# Patient Record
Sex: Female | Born: 1950
Health system: Southern US, Community
[De-identification: ages and names within clinical notes are randomized; demographics above are authoritative.]

## PROBLEM LIST (undated history)

## (undated) DIAGNOSIS — C4490 Unspecified malignant neoplasm of skin, unspecified: Secondary | ICD-10-CM

## (undated) HISTORY — PX: SKIN CANCER EXCISION: SHX779

---

## 1998-12-29 ENCOUNTER — Other Ambulatory Visit: Admission: RE | Admit: 1998-12-29 | Discharge: 1998-12-29 | Payer: Self-pay | Admitting: Obstetrics and Gynecology

## 2000-02-25 ENCOUNTER — Other Ambulatory Visit: Admission: RE | Admit: 2000-02-25 | Discharge: 2000-02-25 | Payer: Self-pay | Admitting: Obstetrics and Gynecology

## 2000-03-08 ENCOUNTER — Encounter: Admission: RE | Admit: 2000-03-08 | Discharge: 2000-03-08 | Payer: Self-pay | Admitting: Obstetrics and Gynecology

## 2000-03-08 ENCOUNTER — Encounter: Payer: Self-pay | Admitting: Obstetrics and Gynecology

## 2000-03-14 ENCOUNTER — Encounter: Admission: RE | Admit: 2000-03-14 | Discharge: 2000-03-14 | Payer: Self-pay | Admitting: Obstetrics and Gynecology

## 2000-03-14 ENCOUNTER — Encounter: Payer: Self-pay | Admitting: Obstetrics and Gynecology

## 2001-03-07 ENCOUNTER — Other Ambulatory Visit: Admission: RE | Admit: 2001-03-07 | Discharge: 2001-03-07 | Payer: Self-pay | Admitting: Obstetrics and Gynecology

## 2001-08-10 ENCOUNTER — Encounter: Admission: RE | Admit: 2001-08-10 | Discharge: 2001-08-10 | Payer: Self-pay | Admitting: Obstetrics and Gynecology

## 2001-08-10 ENCOUNTER — Encounter: Payer: Self-pay | Admitting: Obstetrics and Gynecology

## 2002-04-05 ENCOUNTER — Other Ambulatory Visit: Admission: RE | Admit: 2002-04-05 | Discharge: 2002-04-05 | Payer: Self-pay | Admitting: Obstetrics and Gynecology

## 2003-07-11 ENCOUNTER — Other Ambulatory Visit: Admission: RE | Admit: 2003-07-11 | Discharge: 2003-07-11 | Payer: Self-pay | Admitting: Obstetrics and Gynecology

## 2004-09-21 ENCOUNTER — Other Ambulatory Visit: Admission: RE | Admit: 2004-09-21 | Discharge: 2004-09-21 | Payer: Self-pay | Admitting: Obstetrics and Gynecology

## 2005-01-15 ENCOUNTER — Ambulatory Visit: Payer: Self-pay | Admitting: Cardiology

## 2005-10-21 ENCOUNTER — Other Ambulatory Visit: Admission: RE | Admit: 2005-10-21 | Discharge: 2005-10-21 | Payer: Self-pay | Admitting: Obstetrics and Gynecology

## 2010-02-09 ENCOUNTER — Encounter: Payer: Self-pay | Admitting: Cardiology

## 2010-02-09 ENCOUNTER — Encounter: Payer: Self-pay | Admitting: Internal Medicine

## 2010-02-13 ENCOUNTER — Ambulatory Visit: Payer: Self-pay | Admitting: Cardiology

## 2010-02-13 DIAGNOSIS — R079 Chest pain, unspecified: Secondary | ICD-10-CM | POA: Insufficient documentation

## 2010-11-10 NOTE — Assessment & Plan Note (Signed)
Summary: ec6/chest pain with excertion/jml   Visit Type:  Follow-up Primary Provider:  [facility] in [place]  CC:  chest pain.  History of Present Illness: The patient is seen for the evaluation of chest pain.  I had seen her in 2006.  At that time she appeared to be stable and no further workup was done.  We know that her echocardiogram in 2003 showed normal LV function.  She has a history of postural orthostatic tachycardia and has been seen by Dr.Klein for this.  This has not been initiated for her.  In the past few days she had some chest discomfort and she was assessed by her primary care team.  She is now seen today for further followup.  She realizes now that she had some reflux.  Meds were started for this and she feels completely better.  There is a history of coronary disease in the family.  After careful review it is noted that she does not have a sibling or parent with coronary disease at a young age.  Her symptoms recently do not sound like angina.  Current Medications (verified): 1)  Prilosec 20 Mg Cpdr (Omeprazole) .... As Needed 2)  Vitamin C 500 Mg  Tabs (Ascorbic Acid) .... Once Daily 3)  Fish Oil   Oil (Fish Oil) .... Once Daily 4)  Calcium Carbonate-Vitamin D 600-400 Mg-Unit  Tabs (Calcium Carbonate-Vitamin D) .... Two Times A Day  Allergies (verified): No Known Drug Allergies  Past History:  Past Medical History: Last updated: 02/13/2010 LV  normal... echo... 2003 Dysautonomia and postural orthostatic tachycardia.... Dr.Klein Short PR interval Chest pain  2006... insignificant... no stress test   Family History: Family History of Coronary Artery Disease:  Family History of Hyperlipidemia:  Family History of Hypertension:   Social History: Married  Tobacco Use - No.  Alcohol Use - yes -- wine Regular Exercise - yes Full Time -- self employed realtor  Review of Systems       Patient denies fever, chills, headache, sweats, rash, change in  vision, change in hearing, shortness of breath, cough, nausea or vomiting, urinary symptoms.  All of the systems are reviewed and are negative.  Vital Signs:  Patient profile:   60 year old female Height:      62 inches Weight:      112 pounds BMI:     20.56 Pulse rate:   65 / minute BP sitting:   124 / 88  (left arm) Cuff size:   regular  Vitals Entered By: Hardin Negus, RMA (Feb 13, 2010 11:20 AM)  Physical Exam  General:  patient is stable today. Head:  head is atraumatic. Eyes:  no xanthelasma. Neck:  no jugular venous distention. Chest Wall:  no chest wall tenderness. Lungs:  lungs are clear.  Respiratory effort is nonlabored. Heart:  cardiac exam reveals S1 and S2.  There are no clicks or significant murmurs. Abdomen:  abdomen soft. Msk:  patient has some shortening of the fingers on her left hand. Extremities:  no peripheral edema. Skin:  no skin rashes. Psych:  patient is oriented to person time and place.  Affect is normal.   Impression & Recommendations:  Problem # 1:  * POSTURAL ORTHOSTATIC TACHYCARDIA The patient has not been having any problems with her tachycardia.  Further workup is not needed.  Problem # 2:  * SHORT PR INTERVAL EKG again shows short PR interval.  No further workup.  Problem # 3:  CHEST PAIN (ICD-786.50)  At this point I doubt that the patient's chest discomfort is cardiac in origin.  EKG had been done on Feb 09, 2010.  It is compared with a tracing of April 7, 006.  She has a short PR interval.  There is no significant changes.  I carefully reviewed the entire situation with the patient.  I feel that her pain is not cardiac in origin.  She and I discussed whether we should proceed with exercise test.  I feel it is not necessary and she would rather wait to see if she has any recurring problems.  I am comfortable with this.  I will see the patient back on an as-needed basis.  Patient Instructions: 1)  Your physician recommends that you  schedule a follow-up appointment in: as needed with Dr. Myrtis Ser 2)  Your physician recommends that you continue on your current medications as directed. Please refer to the Current Medication list given to you today.

## 2010-11-10 NOTE — Miscellaneous (Signed)
  Clinical Lists Changes  Problems: Added new problem of * POSTURAL ORTHOSTATIC TACHYCARDIA Added new problem of * SHORT PR INTERVAL Added new problem of CHEST PAIN (ICD-786.50) Observations: Added new observation of PAST MED HX: LV  normal... echo... 2003 Dysautonomia and postural orthostatic tachycardia.... Dr.Klein Short PR interval Chest pain  2006... insignificant... no stress test  (02/13/2010 8:35)       Past History:  Past Medical History: LV  normal... echo... 2003 Dysautonomia and postural orthostatic tachycardia.... Dr.Klein Short PR interval Chest pain  2006... insignificant... no stress test

## 2010-11-10 NOTE — Letter (Signed)
Summary: [facility] Family Practice Office Note  [facility] Family Practice Office Note   Imported By: Roderic Ovens 02/26/2010 16:07:51  _____________________________________________________________________  External Attachment:    Type:   Image     Comment:   External Document

## 2016-04-20 DIAGNOSIS — R42 Dizziness and giddiness: Secondary | ICD-10-CM | POA: Diagnosis not present

## 2016-04-20 DIAGNOSIS — H6983 Other specified disorders of Eustachian tube, bilateral: Secondary | ICD-10-CM | POA: Diagnosis not present

## 2016-04-20 DIAGNOSIS — F418 Other specified anxiety disorders: Secondary | ICD-10-CM | POA: Diagnosis not present

## 2016-04-29 DIAGNOSIS — C44519 Basal cell carcinoma of skin of other part of trunk: Secondary | ICD-10-CM | POA: Diagnosis not present

## 2016-06-16 DIAGNOSIS — L578 Other skin changes due to chronic exposure to nonionizing radiation: Secondary | ICD-10-CM | POA: Diagnosis not present

## 2016-06-16 DIAGNOSIS — Z85828 Personal history of other malignant neoplasm of skin: Secondary | ICD-10-CM | POA: Diagnosis not present

## 2016-06-16 DIAGNOSIS — L910 Hypertrophic scar: Secondary | ICD-10-CM | POA: Diagnosis not present

## 2016-06-16 DIAGNOSIS — C44612 Basal cell carcinoma of skin of right upper limb, including shoulder: Secondary | ICD-10-CM | POA: Diagnosis not present

## 2016-07-19 ENCOUNTER — Other Ambulatory Visit: Payer: Self-pay | Admitting: Obstetrics and Gynecology

## 2016-07-19 DIAGNOSIS — Z1231 Encounter for screening mammogram for malignant neoplasm of breast: Secondary | ICD-10-CM

## 2016-08-09 DIAGNOSIS — D225 Melanocytic nevi of trunk: Secondary | ICD-10-CM | POA: Diagnosis not present

## 2016-08-09 DIAGNOSIS — D1801 Hemangioma of skin and subcutaneous tissue: Secondary | ICD-10-CM | POA: Diagnosis not present

## 2016-08-09 DIAGNOSIS — L814 Other melanin hyperpigmentation: Secondary | ICD-10-CM | POA: Diagnosis not present

## 2016-08-09 DIAGNOSIS — Z85828 Personal history of other malignant neoplasm of skin: Secondary | ICD-10-CM | POA: Diagnosis not present

## 2016-08-09 DIAGNOSIS — L821 Other seborrheic keratosis: Secondary | ICD-10-CM | POA: Diagnosis not present

## 2016-08-09 DIAGNOSIS — Z85820 Personal history of malignant melanoma of skin: Secondary | ICD-10-CM | POA: Diagnosis not present

## 2016-08-09 DIAGNOSIS — L570 Actinic keratosis: Secondary | ICD-10-CM | POA: Diagnosis not present

## 2016-08-12 DIAGNOSIS — H2513 Age-related nuclear cataract, bilateral: Secondary | ICD-10-CM | POA: Diagnosis not present

## 2016-10-07 ENCOUNTER — Ambulatory Visit
Admission: RE | Admit: 2016-10-07 | Discharge: 2016-10-07 | Disposition: A | Discharge status: Home / Self Care | Payer: PPO | Source: Ambulatory Visit | Attending: Obstetrics and Gynecology | Admitting: Obstetrics and Gynecology

## 2016-10-07 ENCOUNTER — Encounter: Payer: Self-pay | Admitting: Radiology

## 2016-10-07 DIAGNOSIS — Z1231 Encounter for screening mammogram for malignant neoplasm of breast: Secondary | ICD-10-CM

## 2016-10-14 ENCOUNTER — Other Ambulatory Visit: Payer: Self-pay | Admitting: Obstetrics and Gynecology

## 2016-10-14 DIAGNOSIS — R928 Other abnormal and inconclusive findings on diagnostic imaging of breast: Secondary | ICD-10-CM

## 2016-10-18 ENCOUNTER — Other Ambulatory Visit: Payer: PPO

## 2016-10-21 ENCOUNTER — Ambulatory Visit
Admission: RE | Admit: 2016-10-21 | Discharge: 2016-10-21 | Disposition: A | Discharge status: Home / Self Care | Payer: PPO | Source: Ambulatory Visit | Attending: Obstetrics and Gynecology | Admitting: Obstetrics and Gynecology

## 2016-10-21 DIAGNOSIS — R928 Other abnormal and inconclusive findings on diagnostic imaging of breast: Secondary | ICD-10-CM

## 2016-10-21 DIAGNOSIS — N6489 Other specified disorders of breast: Secondary | ICD-10-CM | POA: Diagnosis not present

## 2016-10-21 DIAGNOSIS — R922 Inconclusive mammogram: Secondary | ICD-10-CM | POA: Diagnosis not present

## 2016-11-04 DIAGNOSIS — F418 Other specified anxiety disorders: Secondary | ICD-10-CM | POA: Diagnosis not present

## 2016-11-04 DIAGNOSIS — J111 Influenza due to unidentified influenza virus with other respiratory manifestations: Secondary | ICD-10-CM | POA: Diagnosis not present

## 2016-11-10 ENCOUNTER — Other Ambulatory Visit: Payer: Self-pay | Admitting: Gynecology

## 2016-11-10 DIAGNOSIS — M810 Age-related osteoporosis without current pathological fracture: Secondary | ICD-10-CM

## 2016-12-14 ENCOUNTER — Other Ambulatory Visit: Payer: PPO

## 2016-12-15 ENCOUNTER — Ambulatory Visit
Admission: RE | Admit: 2016-12-15 | Discharge: 2016-12-15 | Disposition: A | Discharge status: Home / Self Care | Payer: PPO | Source: Ambulatory Visit | Attending: Gynecology | Admitting: Gynecology

## 2016-12-15 DIAGNOSIS — Z780 Asymptomatic menopausal state: Secondary | ICD-10-CM | POA: Diagnosis not present

## 2016-12-15 DIAGNOSIS — M810 Age-related osteoporosis without current pathological fracture: Secondary | ICD-10-CM

## 2017-01-04 DIAGNOSIS — Z01419 Encounter for gynecological examination (general) (routine) without abnormal findings: Secondary | ICD-10-CM | POA: Diagnosis not present

## 2017-01-04 DIAGNOSIS — N898 Other specified noninflammatory disorders of vagina: Secondary | ICD-10-CM | POA: Diagnosis not present

## 2017-01-04 DIAGNOSIS — Z780 Asymptomatic menopausal state: Secondary | ICD-10-CM | POA: Diagnosis not present

## 2017-01-04 DIAGNOSIS — M810 Age-related osteoporosis without current pathological fracture: Secondary | ICD-10-CM | POA: Diagnosis not present

## 2017-02-08 DIAGNOSIS — D225 Melanocytic nevi of trunk: Secondary | ICD-10-CM | POA: Diagnosis not present

## 2017-02-08 DIAGNOSIS — Z85820 Personal history of malignant melanoma of skin: Secondary | ICD-10-CM | POA: Diagnosis not present

## 2017-02-08 DIAGNOSIS — L570 Actinic keratosis: Secondary | ICD-10-CM | POA: Diagnosis not present

## 2017-02-08 DIAGNOSIS — D1801 Hemangioma of skin and subcutaneous tissue: Secondary | ICD-10-CM | POA: Diagnosis not present

## 2017-02-08 DIAGNOSIS — L820 Inflamed seborrheic keratosis: Secondary | ICD-10-CM | POA: Diagnosis not present

## 2017-02-17 DIAGNOSIS — M546 Pain in thoracic spine: Secondary | ICD-10-CM | POA: Diagnosis not present

## 2017-02-17 DIAGNOSIS — H6983 Other specified disorders of Eustachian tube, bilateral: Secondary | ICD-10-CM | POA: Diagnosis not present

## 2017-02-17 DIAGNOSIS — Z1211 Encounter for screening for malignant neoplasm of colon: Secondary | ICD-10-CM | POA: Diagnosis not present

## 2017-03-11 DIAGNOSIS — M546 Pain in thoracic spine: Secondary | ICD-10-CM | POA: Diagnosis not present

## 2017-03-11 DIAGNOSIS — M545 Low back pain: Secondary | ICD-10-CM | POA: Diagnosis not present

## 2017-03-15 DIAGNOSIS — M546 Pain in thoracic spine: Secondary | ICD-10-CM | POA: Diagnosis not present

## 2017-03-15 DIAGNOSIS — M545 Low back pain: Secondary | ICD-10-CM | POA: Diagnosis not present

## 2017-03-17 DIAGNOSIS — M545 Low back pain: Secondary | ICD-10-CM | POA: Diagnosis not present

## 2017-03-17 DIAGNOSIS — M546 Pain in thoracic spine: Secondary | ICD-10-CM | POA: Diagnosis not present

## 2017-03-22 DIAGNOSIS — M546 Pain in thoracic spine: Secondary | ICD-10-CM | POA: Diagnosis not present

## 2017-03-22 DIAGNOSIS — M545 Low back pain: Secondary | ICD-10-CM | POA: Diagnosis not present

## 2017-03-24 DIAGNOSIS — M546 Pain in thoracic spine: Secondary | ICD-10-CM | POA: Diagnosis not present

## 2017-03-24 DIAGNOSIS — M545 Low back pain: Secondary | ICD-10-CM | POA: Diagnosis not present

## 2017-03-29 DIAGNOSIS — M546 Pain in thoracic spine: Secondary | ICD-10-CM | POA: Diagnosis not present

## 2017-03-29 DIAGNOSIS — M545 Low back pain: Secondary | ICD-10-CM | POA: Diagnosis not present

## 2017-03-30 DIAGNOSIS — R195 Other fecal abnormalities: Secondary | ICD-10-CM | POA: Diagnosis not present

## 2017-03-31 DIAGNOSIS — M546 Pain in thoracic spine: Secondary | ICD-10-CM | POA: Diagnosis not present

## 2017-03-31 DIAGNOSIS — M545 Low back pain: Secondary | ICD-10-CM | POA: Diagnosis not present

## 2017-04-05 DIAGNOSIS — M545 Low back pain: Secondary | ICD-10-CM | POA: Diagnosis not present

## 2017-04-05 DIAGNOSIS — M546 Pain in thoracic spine: Secondary | ICD-10-CM | POA: Diagnosis not present

## 2017-04-07 DIAGNOSIS — M546 Pain in thoracic spine: Secondary | ICD-10-CM | POA: Diagnosis not present

## 2017-04-07 DIAGNOSIS — M545 Low back pain: Secondary | ICD-10-CM | POA: Diagnosis not present

## 2017-04-14 DIAGNOSIS — M546 Pain in thoracic spine: Secondary | ICD-10-CM | POA: Diagnosis not present

## 2017-04-14 DIAGNOSIS — M545 Low back pain: Secondary | ICD-10-CM | POA: Diagnosis not present

## 2017-04-22 DIAGNOSIS — M546 Pain in thoracic spine: Secondary | ICD-10-CM | POA: Diagnosis not present

## 2017-04-22 DIAGNOSIS — M545 Low back pain: Secondary | ICD-10-CM | POA: Diagnosis not present

## 2017-05-06 DIAGNOSIS — M546 Pain in thoracic spine: Secondary | ICD-10-CM | POA: Diagnosis not present

## 2017-05-06 DIAGNOSIS — M545 Low back pain: Secondary | ICD-10-CM | POA: Diagnosis not present

## 2017-06-16 DIAGNOSIS — H6692 Otitis media, unspecified, left ear: Secondary | ICD-10-CM | POA: Diagnosis not present

## 2017-06-16 DIAGNOSIS — M62838 Other muscle spasm: Secondary | ICD-10-CM | POA: Diagnosis not present

## 2017-08-30 DIAGNOSIS — H2513 Age-related nuclear cataract, bilateral: Secondary | ICD-10-CM | POA: Diagnosis not present

## 2017-09-13 ENCOUNTER — Other Ambulatory Visit: Payer: Self-pay | Admitting: Obstetrics and Gynecology

## 2017-09-13 DIAGNOSIS — Z139 Encounter for screening, unspecified: Secondary | ICD-10-CM

## 2017-10-25 ENCOUNTER — Ambulatory Visit: Payer: PPO

## 2017-10-27 DIAGNOSIS — H6983 Other specified disorders of Eustachian tube, bilateral: Secondary | ICD-10-CM | POA: Diagnosis not present

## 2017-11-09 ENCOUNTER — Ambulatory Visit
Admission: RE | Admit: 2017-11-09 | Discharge: 2017-11-09 | Disposition: A | Discharge status: Home / Self Care | Payer: PPO | Source: Ambulatory Visit | Attending: Obstetrics and Gynecology | Admitting: Obstetrics and Gynecology

## 2017-11-09 DIAGNOSIS — Z139 Encounter for screening, unspecified: Secondary | ICD-10-CM

## 2017-11-09 DIAGNOSIS — Z1231 Encounter for screening mammogram for malignant neoplasm of breast: Secondary | ICD-10-CM | POA: Diagnosis not present

## 2017-11-18 DIAGNOSIS — H25043 Posterior subcapsular polar age-related cataract, bilateral: Secondary | ICD-10-CM | POA: Diagnosis not present

## 2017-11-18 DIAGNOSIS — H25013 Cortical age-related cataract, bilateral: Secondary | ICD-10-CM | POA: Diagnosis not present

## 2017-11-18 DIAGNOSIS — H2511 Age-related nuclear cataract, right eye: Secondary | ICD-10-CM | POA: Diagnosis not present

## 2017-11-18 DIAGNOSIS — H02839 Dermatochalasis of unspecified eye, unspecified eyelid: Secondary | ICD-10-CM | POA: Diagnosis not present

## 2017-11-18 DIAGNOSIS — H2513 Age-related nuclear cataract, bilateral: Secondary | ICD-10-CM | POA: Diagnosis not present

## 2017-12-29 DIAGNOSIS — Z85820 Personal history of malignant melanoma of skin: Secondary | ICD-10-CM | POA: Diagnosis not present

## 2017-12-29 DIAGNOSIS — L814 Other melanin hyperpigmentation: Secondary | ICD-10-CM | POA: Diagnosis not present

## 2017-12-29 DIAGNOSIS — L570 Actinic keratosis: Secondary | ICD-10-CM | POA: Diagnosis not present

## 2017-12-29 DIAGNOSIS — L820 Inflamed seborrheic keratosis: Secondary | ICD-10-CM | POA: Diagnosis not present

## 2017-12-29 DIAGNOSIS — D1801 Hemangioma of skin and subcutaneous tissue: Secondary | ICD-10-CM | POA: Diagnosis not present

## 2017-12-29 DIAGNOSIS — D225 Melanocytic nevi of trunk: Secondary | ICD-10-CM | POA: Diagnosis not present

## 2017-12-29 DIAGNOSIS — L578 Other skin changes due to chronic exposure to nonionizing radiation: Secondary | ICD-10-CM | POA: Diagnosis not present

## 2018-01-16 DIAGNOSIS — H2511 Age-related nuclear cataract, right eye: Secondary | ICD-10-CM | POA: Diagnosis not present

## 2018-01-16 DIAGNOSIS — H2513 Age-related nuclear cataract, bilateral: Secondary | ICD-10-CM | POA: Diagnosis not present

## 2018-01-17 DIAGNOSIS — H2512 Age-related nuclear cataract, left eye: Secondary | ICD-10-CM | POA: Diagnosis not present

## 2018-01-30 DIAGNOSIS — H2513 Age-related nuclear cataract, bilateral: Secondary | ICD-10-CM | POA: Diagnosis not present

## 2018-01-30 DIAGNOSIS — H2512 Age-related nuclear cataract, left eye: Secondary | ICD-10-CM | POA: Diagnosis not present

## 2018-02-14 DIAGNOSIS — M549 Dorsalgia, unspecified: Secondary | ICD-10-CM | POA: Diagnosis not present

## 2018-02-14 DIAGNOSIS — S32050D Wedge compression fracture of fifth lumbar vertebra, subsequent encounter for fracture with routine healing: Secondary | ICD-10-CM | POA: Diagnosis not present

## 2018-02-16 ENCOUNTER — Other Ambulatory Visit: Payer: Self-pay | Admitting: Family Medicine

## 2018-02-16 DIAGNOSIS — S32050D Wedge compression fracture of fifth lumbar vertebra, subsequent encounter for fracture with routine healing: Secondary | ICD-10-CM

## 2018-02-19 ENCOUNTER — Ambulatory Visit
Admission: RE | Admit: 2018-02-19 | Discharge: 2018-02-19 | Disposition: A | Discharge status: Home / Self Care | Payer: PPO | Source: Ambulatory Visit | Attending: Family Medicine | Admitting: Family Medicine

## 2018-02-19 DIAGNOSIS — S32050D Wedge compression fracture of fifth lumbar vertebra, subsequent encounter for fracture with routine healing: Secondary | ICD-10-CM

## 2018-02-19 DIAGNOSIS — M545 Low back pain: Secondary | ICD-10-CM | POA: Diagnosis not present

## 2018-03-21 ENCOUNTER — Ambulatory Visit
Admission: RE | Admit: 2018-03-21 | Discharge: 2018-03-21 | Disposition: A | Discharge status: Home / Self Care | Payer: PPO | Source: Ambulatory Visit | Attending: Family Medicine | Admitting: Family Medicine

## 2018-03-21 ENCOUNTER — Other Ambulatory Visit: Payer: Self-pay | Admitting: Family Medicine

## 2018-03-21 DIAGNOSIS — S32050D Wedge compression fracture of fifth lumbar vertebra, subsequent encounter for fracture with routine healing: Secondary | ICD-10-CM

## 2018-03-21 DIAGNOSIS — M4186 Other forms of scoliosis, lumbar region: Secondary | ICD-10-CM | POA: Diagnosis not present

## 2018-05-08 DIAGNOSIS — G8929 Other chronic pain: Secondary | ICD-10-CM | POA: Diagnosis not present

## 2018-05-08 DIAGNOSIS — M545 Low back pain: Secondary | ICD-10-CM | POA: Diagnosis not present

## 2018-05-08 DIAGNOSIS — S32050S Wedge compression fracture of fifth lumbar vertebra, sequela: Secondary | ICD-10-CM | POA: Diagnosis not present

## 2018-05-08 DIAGNOSIS — M546 Pain in thoracic spine: Secondary | ICD-10-CM | POA: Diagnosis not present

## 2018-05-09 ENCOUNTER — Other Ambulatory Visit: Payer: Self-pay | Admitting: Family Medicine

## 2018-05-09 ENCOUNTER — Ambulatory Visit
Admission: RE | Admit: 2018-05-09 | Discharge: 2018-05-09 | Disposition: A | Discharge status: Home / Self Care | Payer: PPO | Source: Ambulatory Visit | Attending: Family Medicine | Admitting: Family Medicine

## 2018-05-09 DIAGNOSIS — M545 Low back pain: Secondary | ICD-10-CM

## 2018-05-09 DIAGNOSIS — M546 Pain in thoracic spine: Secondary | ICD-10-CM

## 2018-05-09 DIAGNOSIS — M8448XA Pathological fracture, other site, initial encounter for fracture: Secondary | ICD-10-CM | POA: Diagnosis not present

## 2018-05-29 DIAGNOSIS — Z1211 Encounter for screening for malignant neoplasm of colon: Secondary | ICD-10-CM | POA: Diagnosis not present

## 2018-05-31 DIAGNOSIS — S22080S Wedge compression fracture of T11-T12 vertebra, sequela: Secondary | ICD-10-CM | POA: Diagnosis not present

## 2018-05-31 DIAGNOSIS — M545 Low back pain: Secondary | ICD-10-CM | POA: Diagnosis not present

## 2018-05-31 DIAGNOSIS — S32050S Wedge compression fracture of fifth lumbar vertebra, sequela: Secondary | ICD-10-CM | POA: Diagnosis not present

## 2018-06-02 DIAGNOSIS — S32050S Wedge compression fracture of fifth lumbar vertebra, sequela: Secondary | ICD-10-CM | POA: Diagnosis not present

## 2018-06-02 DIAGNOSIS — S22080S Wedge compression fracture of T11-T12 vertebra, sequela: Secondary | ICD-10-CM | POA: Diagnosis not present

## 2018-06-02 DIAGNOSIS — M545 Low back pain: Secondary | ICD-10-CM | POA: Diagnosis not present

## 2018-06-04 IMAGING — MG 2D DIGITAL DIAGNOSTIC UNILATERAL RIGHT MAMMOGRAM WITH CAD AND AD
9 series · 9 of 21 positions shown · non-contrast
Comparison: Previous exam(s).

CLINICAL DATA: The patient was called back for a possible mass in
the medial right breast

EXAM:
2D DIGITAL DIAGNOSTIC RIGHT MAMMOGRAM WITH CAD AND ADJUNCT TOMO
ULTRASOUND RIGHT BREAST

[R ML]
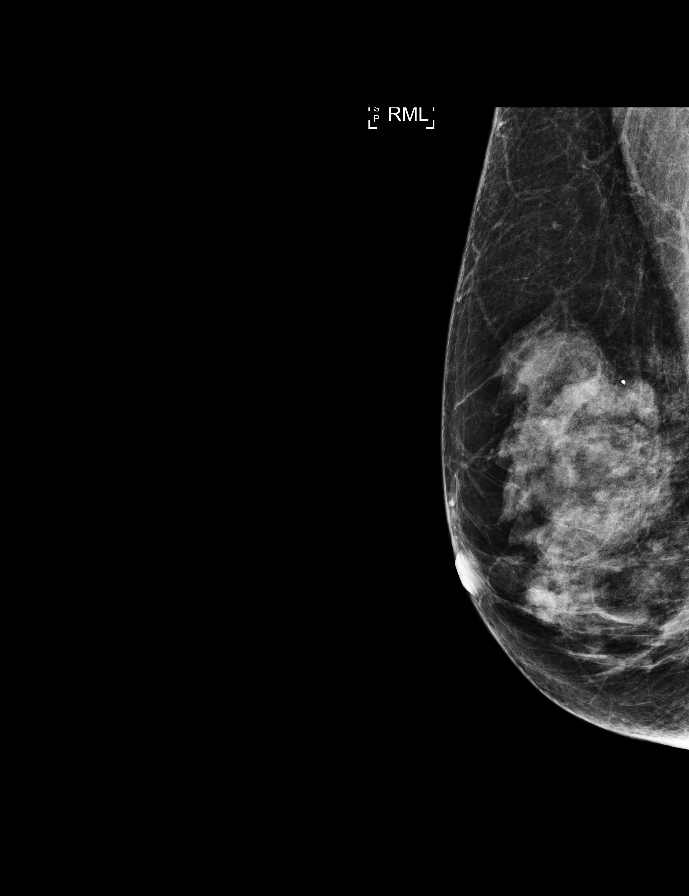

[R ML synth-2D]
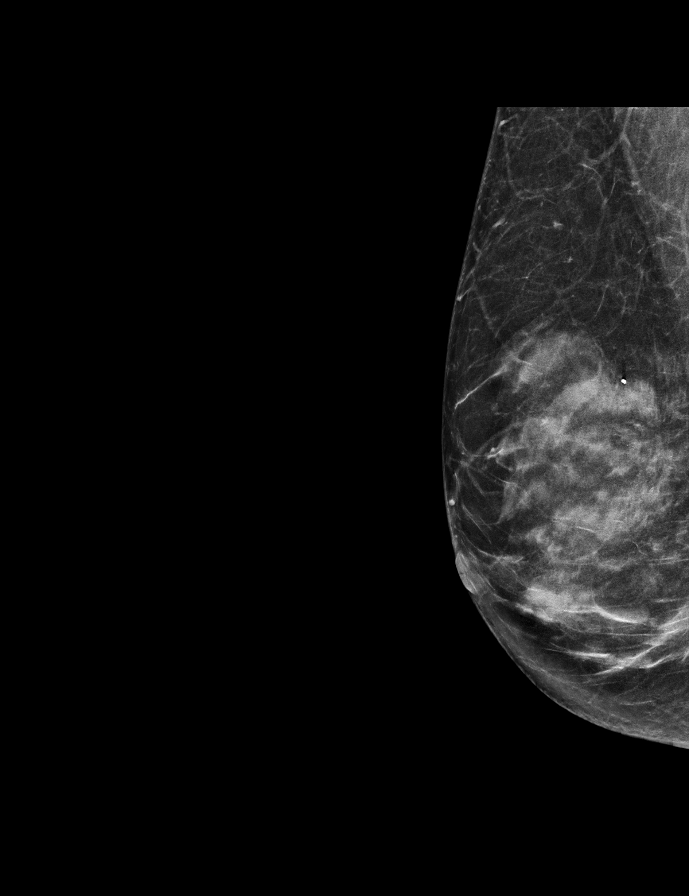

[R CC (1 of 2)]
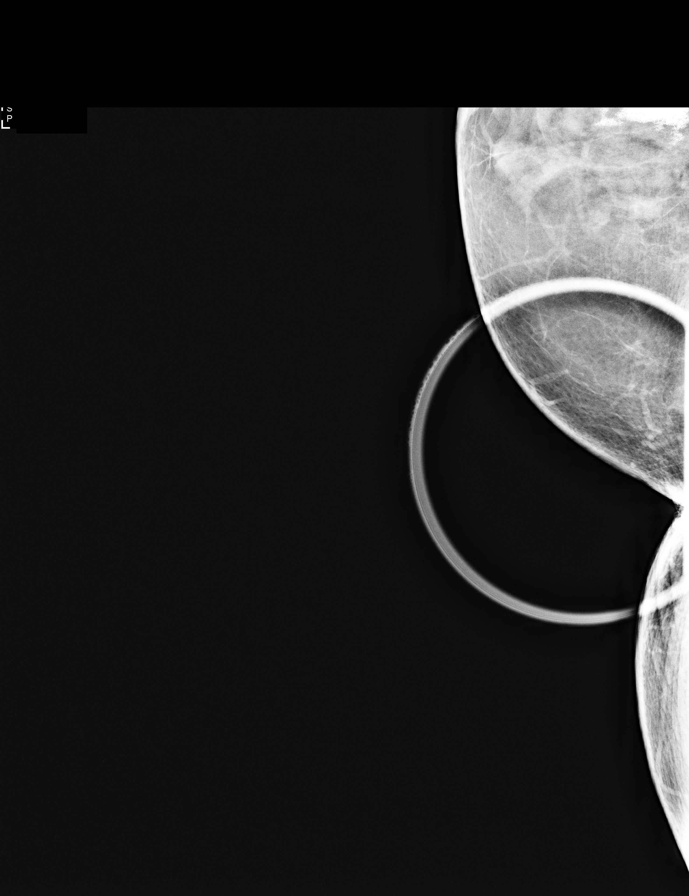

[R CC synth-2D (1 of 2)]
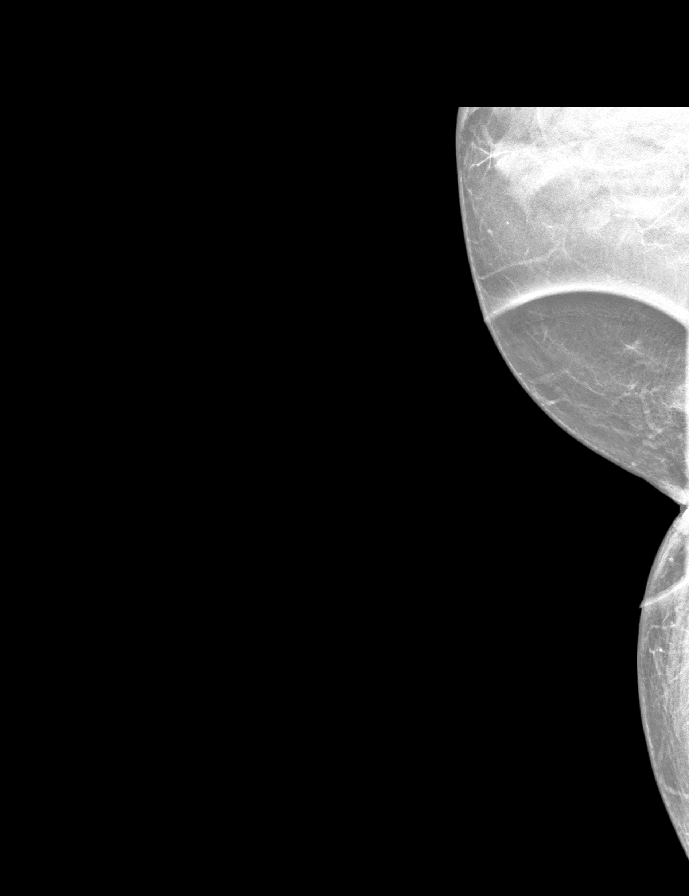

[R CC (2 of 2)]
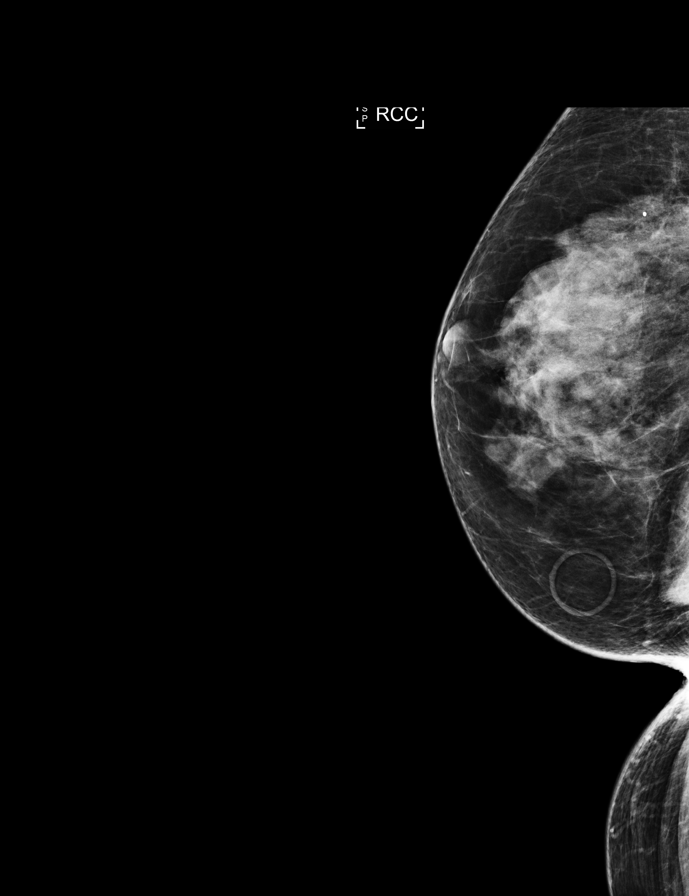

[R CC synth-2D (2 of 2)]
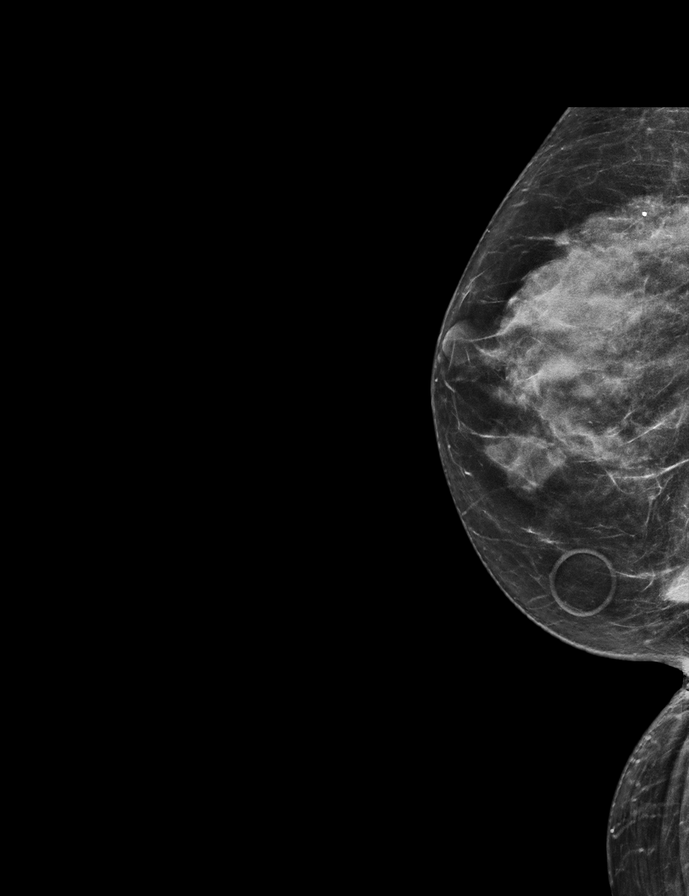

[R CC tomo (1 of 2) · tomo slice 31/60.0]
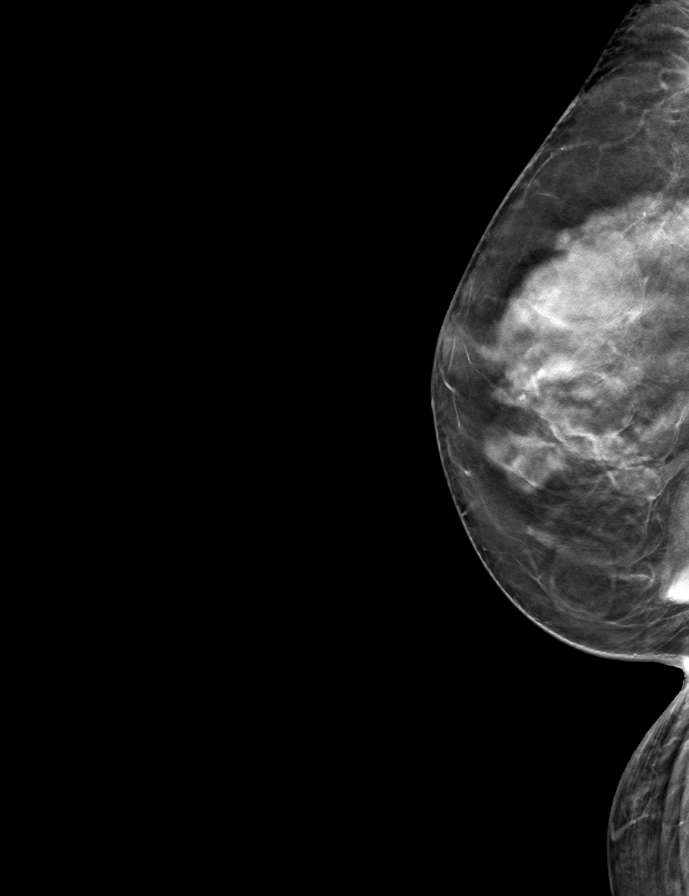

[R ML tomo · tomo slice 28/55.0]
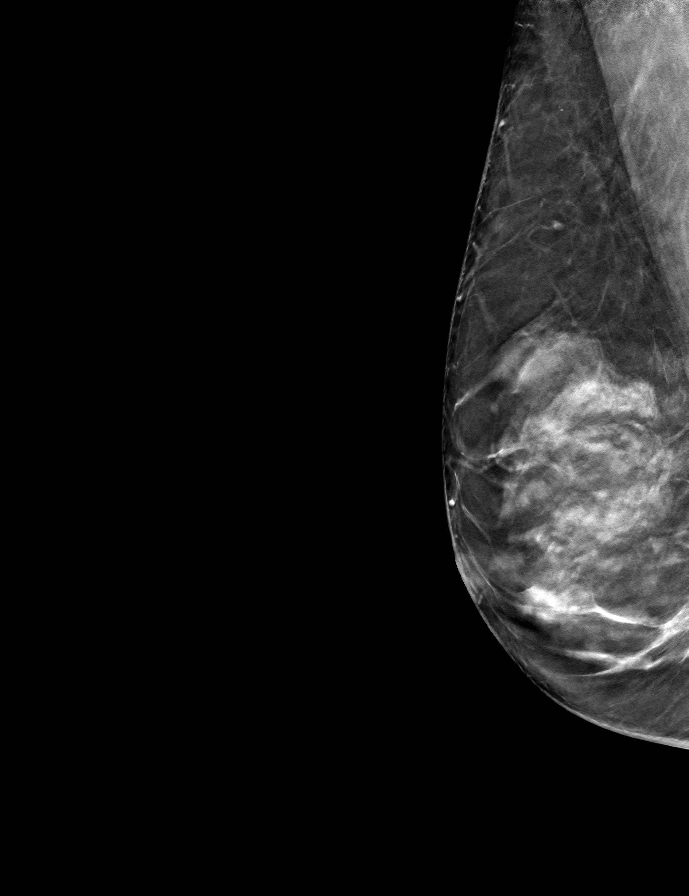

[R CC tomo (2 of 2) · tomo slice 20/39.0]
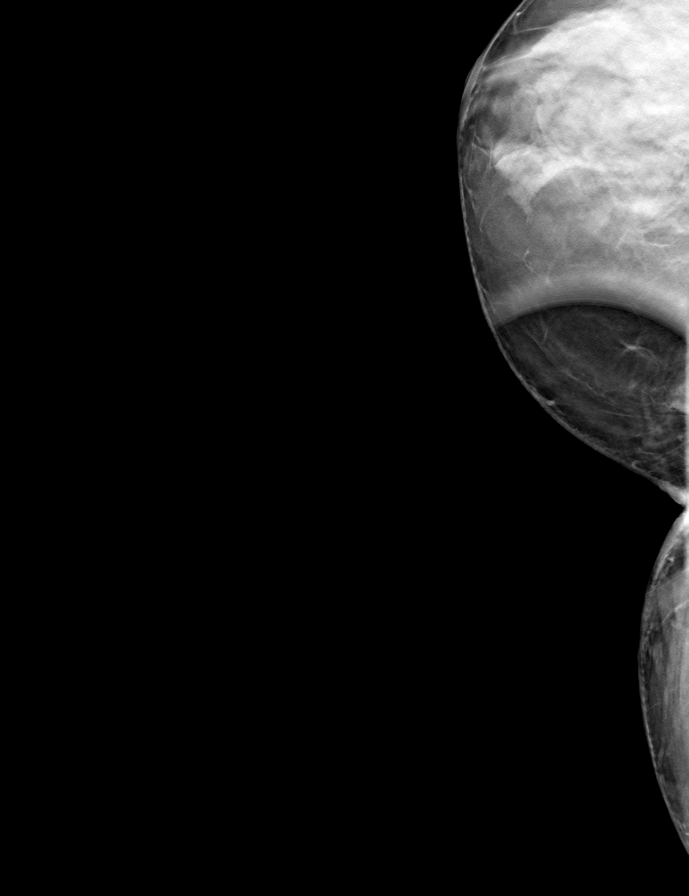

[9 of 21 positions shown; findings below may reference images not displayed]

ACR Breast Density Category c: The breast tissue is heterogeneously
dense, which may obscure small masses.
FINDINGS: The possible mass in medial right breast appears to represent a
sternalis muscle.

Mammographic images were processed with CAD.

On physical exam, no suspicious lumps identified.

Targeted ultrasound is performed, showing no abnormalities in the
medial left breast.
IMPRESSION: There is a sternalis muscle seen medially. No mammographic or
sonographic evidence of malignancy.

RECOMMENDATION:
Annual screening mammography

I have discussed the findings and recommendations with the patient.
Results were also provided in writing at the conclusion of the
visit. If applicable, a reminder letter will be sent to the patient
regarding the next appointment.

BI-RADS CATEGORY  2: Benign.

## 2018-06-06 DIAGNOSIS — S32050S Wedge compression fracture of fifth lumbar vertebra, sequela: Secondary | ICD-10-CM | POA: Diagnosis not present

## 2018-06-06 DIAGNOSIS — S22080S Wedge compression fracture of T11-T12 vertebra, sequela: Secondary | ICD-10-CM | POA: Diagnosis not present

## 2018-06-06 DIAGNOSIS — M545 Low back pain: Secondary | ICD-10-CM | POA: Diagnosis not present

## 2018-06-08 DIAGNOSIS — S22080S Wedge compression fracture of T11-T12 vertebra, sequela: Secondary | ICD-10-CM | POA: Diagnosis not present

## 2018-06-08 DIAGNOSIS — S32050S Wedge compression fracture of fifth lumbar vertebra, sequela: Secondary | ICD-10-CM | POA: Diagnosis not present

## 2018-06-08 DIAGNOSIS — M545 Low back pain: Secondary | ICD-10-CM | POA: Diagnosis not present

## 2018-06-14 DIAGNOSIS — S22080S Wedge compression fracture of T11-T12 vertebra, sequela: Secondary | ICD-10-CM | POA: Diagnosis not present

## 2018-06-14 DIAGNOSIS — M545 Low back pain: Secondary | ICD-10-CM | POA: Diagnosis not present

## 2018-06-14 DIAGNOSIS — S32050S Wedge compression fracture of fifth lumbar vertebra, sequela: Secondary | ICD-10-CM | POA: Diagnosis not present

## 2018-06-16 DIAGNOSIS — S32050S Wedge compression fracture of fifth lumbar vertebra, sequela: Secondary | ICD-10-CM | POA: Diagnosis not present

## 2018-06-16 DIAGNOSIS — M545 Low back pain: Secondary | ICD-10-CM | POA: Diagnosis not present

## 2018-06-16 DIAGNOSIS — S22080S Wedge compression fracture of T11-T12 vertebra, sequela: Secondary | ICD-10-CM | POA: Diagnosis not present

## 2018-06-20 DIAGNOSIS — S22080S Wedge compression fracture of T11-T12 vertebra, sequela: Secondary | ICD-10-CM | POA: Diagnosis not present

## 2018-06-20 DIAGNOSIS — S32050S Wedge compression fracture of fifth lumbar vertebra, sequela: Secondary | ICD-10-CM | POA: Diagnosis not present

## 2018-06-20 DIAGNOSIS — M545 Low back pain: Secondary | ICD-10-CM | POA: Diagnosis not present

## 2018-06-22 DIAGNOSIS — S32050S Wedge compression fracture of fifth lumbar vertebra, sequela: Secondary | ICD-10-CM | POA: Diagnosis not present

## 2018-06-22 DIAGNOSIS — S22080S Wedge compression fracture of T11-T12 vertebra, sequela: Secondary | ICD-10-CM | POA: Diagnosis not present

## 2018-06-22 DIAGNOSIS — M545 Low back pain: Secondary | ICD-10-CM | POA: Diagnosis not present

## 2018-06-28 DIAGNOSIS — S22080S Wedge compression fracture of T11-T12 vertebra, sequela: Secondary | ICD-10-CM | POA: Diagnosis not present

## 2018-06-28 DIAGNOSIS — M545 Low back pain: Secondary | ICD-10-CM | POA: Diagnosis not present

## 2018-06-28 DIAGNOSIS — S32050S Wedge compression fracture of fifth lumbar vertebra, sequela: Secondary | ICD-10-CM | POA: Diagnosis not present

## 2018-06-30 DIAGNOSIS — M545 Low back pain: Secondary | ICD-10-CM | POA: Diagnosis not present

## 2018-06-30 DIAGNOSIS — S22080S Wedge compression fracture of T11-T12 vertebra, sequela: Secondary | ICD-10-CM | POA: Diagnosis not present

## 2018-06-30 DIAGNOSIS — S32050S Wedge compression fracture of fifth lumbar vertebra, sequela: Secondary | ICD-10-CM | POA: Diagnosis not present

## 2018-07-06 DIAGNOSIS — R9431 Abnormal electrocardiogram [ECG] [EKG]: Secondary | ICD-10-CM | POA: Diagnosis not present

## 2018-07-06 DIAGNOSIS — G8929 Other chronic pain: Secondary | ICD-10-CM | POA: Diagnosis not present

## 2018-07-06 DIAGNOSIS — S22080S Wedge compression fracture of T11-T12 vertebra, sequela: Secondary | ICD-10-CM | POA: Diagnosis not present

## 2018-07-06 DIAGNOSIS — M545 Low back pain: Secondary | ICD-10-CM | POA: Diagnosis not present

## 2018-07-06 DIAGNOSIS — S32050S Wedge compression fracture of fifth lumbar vertebra, sequela: Secondary | ICD-10-CM | POA: Diagnosis not present

## 2018-07-06 DIAGNOSIS — R000 Tachycardia, unspecified: Secondary | ICD-10-CM | POA: Diagnosis not present

## 2018-07-11 DIAGNOSIS — S22080S Wedge compression fracture of T11-T12 vertebra, sequela: Secondary | ICD-10-CM | POA: Diagnosis not present

## 2018-07-11 DIAGNOSIS — M545 Low back pain: Secondary | ICD-10-CM | POA: Diagnosis not present

## 2018-07-11 DIAGNOSIS — S32050S Wedge compression fracture of fifth lumbar vertebra, sequela: Secondary | ICD-10-CM | POA: Diagnosis not present

## 2018-07-18 DIAGNOSIS — S22080S Wedge compression fracture of T11-T12 vertebra, sequela: Secondary | ICD-10-CM | POA: Diagnosis not present

## 2018-07-18 DIAGNOSIS — S32050S Wedge compression fracture of fifth lumbar vertebra, sequela: Secondary | ICD-10-CM | POA: Diagnosis not present

## 2018-07-18 DIAGNOSIS — M545 Low back pain: Secondary | ICD-10-CM | POA: Diagnosis not present

## 2018-07-21 DIAGNOSIS — M545 Low back pain: Secondary | ICD-10-CM | POA: Diagnosis not present

## 2018-07-21 DIAGNOSIS — S22080S Wedge compression fracture of T11-T12 vertebra, sequela: Secondary | ICD-10-CM | POA: Diagnosis not present

## 2018-07-21 DIAGNOSIS — S32050S Wedge compression fracture of fifth lumbar vertebra, sequela: Secondary | ICD-10-CM | POA: Diagnosis not present

## 2018-07-25 DIAGNOSIS — M545 Low back pain: Secondary | ICD-10-CM | POA: Diagnosis not present

## 2018-07-25 DIAGNOSIS — S22080S Wedge compression fracture of T11-T12 vertebra, sequela: Secondary | ICD-10-CM | POA: Diagnosis not present

## 2018-07-25 DIAGNOSIS — S32050S Wedge compression fracture of fifth lumbar vertebra, sequela: Secondary | ICD-10-CM | POA: Diagnosis not present

## 2018-07-27 DIAGNOSIS — S32050S Wedge compression fracture of fifth lumbar vertebra, sequela: Secondary | ICD-10-CM | POA: Diagnosis not present

## 2018-07-27 DIAGNOSIS — M545 Low back pain: Secondary | ICD-10-CM | POA: Diagnosis not present

## 2018-07-27 DIAGNOSIS — S22080S Wedge compression fracture of T11-T12 vertebra, sequela: Secondary | ICD-10-CM | POA: Diagnosis not present

## 2018-08-02 DIAGNOSIS — M545 Low back pain: Secondary | ICD-10-CM | POA: Diagnosis not present

## 2018-08-02 DIAGNOSIS — S32050S Wedge compression fracture of fifth lumbar vertebra, sequela: Secondary | ICD-10-CM | POA: Diagnosis not present

## 2018-08-02 DIAGNOSIS — S22080S Wedge compression fracture of T11-T12 vertebra, sequela: Secondary | ICD-10-CM | POA: Diagnosis not present

## 2018-08-03 DIAGNOSIS — L578 Other skin changes due to chronic exposure to nonionizing radiation: Secondary | ICD-10-CM | POA: Diagnosis not present

## 2018-08-03 DIAGNOSIS — L814 Other melanin hyperpigmentation: Secondary | ICD-10-CM | POA: Diagnosis not present

## 2018-08-03 DIAGNOSIS — L821 Other seborrheic keratosis: Secondary | ICD-10-CM | POA: Diagnosis not present

## 2018-08-03 DIAGNOSIS — D225 Melanocytic nevi of trunk: Secondary | ICD-10-CM | POA: Diagnosis not present

## 2018-08-03 DIAGNOSIS — L820 Inflamed seborrheic keratosis: Secondary | ICD-10-CM | POA: Diagnosis not present

## 2018-08-03 DIAGNOSIS — L570 Actinic keratosis: Secondary | ICD-10-CM | POA: Diagnosis not present

## 2018-08-03 DIAGNOSIS — Z85820 Personal history of malignant melanoma of skin: Secondary | ICD-10-CM | POA: Diagnosis not present

## 2018-08-11 DIAGNOSIS — M545 Low back pain: Secondary | ICD-10-CM | POA: Diagnosis not present

## 2018-08-11 DIAGNOSIS — S22080S Wedge compression fracture of T11-T12 vertebra, sequela: Secondary | ICD-10-CM | POA: Diagnosis not present

## 2018-08-11 DIAGNOSIS — S32050S Wedge compression fracture of fifth lumbar vertebra, sequela: Secondary | ICD-10-CM | POA: Diagnosis not present

## 2018-08-23 DIAGNOSIS — F418 Other specified anxiety disorders: Secondary | ICD-10-CM | POA: Diagnosis not present

## 2018-08-23 DIAGNOSIS — F419 Anxiety disorder, unspecified: Secondary | ICD-10-CM | POA: Diagnosis not present

## 2018-08-23 DIAGNOSIS — Z23 Encounter for immunization: Secondary | ICD-10-CM | POA: Diagnosis not present

## 2018-08-23 DIAGNOSIS — Z13220 Encounter for screening for lipoid disorders: Secondary | ICD-10-CM | POA: Diagnosis not present

## 2018-08-23 DIAGNOSIS — Z0000 Encounter for general adult medical examination without abnormal findings: Secondary | ICD-10-CM | POA: Diagnosis not present

## 2018-10-23 ENCOUNTER — Other Ambulatory Visit: Payer: Self-pay | Admitting: Obstetrics and Gynecology

## 2018-10-23 DIAGNOSIS — Z1231 Encounter for screening mammogram for malignant neoplasm of breast: Secondary | ICD-10-CM

## 2018-10-25 ENCOUNTER — Other Ambulatory Visit: Payer: Self-pay | Admitting: Gynecology

## 2018-10-25 DIAGNOSIS — E2839 Other primary ovarian failure: Secondary | ICD-10-CM

## 2018-10-30 ENCOUNTER — Other Ambulatory Visit: Payer: Self-pay | Admitting: Gynecology

## 2018-10-30 DIAGNOSIS — E2839 Other primary ovarian failure: Secondary | ICD-10-CM

## 2018-11-21 ENCOUNTER — Ambulatory Visit
Admission: RE | Admit: 2018-11-21 | Discharge: 2018-11-21 | Disposition: A | Discharge status: Home / Self Care | Payer: PPO | Source: Ambulatory Visit | Attending: Obstetrics and Gynecology | Admitting: Obstetrics and Gynecology

## 2018-11-21 DIAGNOSIS — L570 Actinic keratosis: Secondary | ICD-10-CM | POA: Diagnosis not present

## 2018-11-21 DIAGNOSIS — Z1231 Encounter for screening mammogram for malignant neoplasm of breast: Secondary | ICD-10-CM | POA: Diagnosis not present

## 2018-12-25 ENCOUNTER — Other Ambulatory Visit: Payer: PPO

## 2019-01-11 ENCOUNTER — Other Ambulatory Visit: Payer: PPO

## 2019-01-19 DIAGNOSIS — Z170 Estrogen receptor positive status [ER+]: Secondary | ICD-10-CM | POA: Diagnosis not present

## 2019-01-19 DIAGNOSIS — R42 Dizziness and giddiness: Secondary | ICD-10-CM | POA: Diagnosis not present

## 2019-01-19 DIAGNOSIS — C50412 Malignant neoplasm of upper-outer quadrant of left female breast: Secondary | ICD-10-CM | POA: Diagnosis not present

## 2019-01-19 DIAGNOSIS — H938X1 Other specified disorders of right ear: Secondary | ICD-10-CM | POA: Diagnosis not present

## 2019-03-13 ENCOUNTER — Other Ambulatory Visit: Payer: PPO

## 2019-04-04 DIAGNOSIS — H16223 Keratoconjunctivitis sicca, not specified as Sjogren's, bilateral: Secondary | ICD-10-CM | POA: Diagnosis not present

## 2019-04-04 DIAGNOSIS — H02839 Dermatochalasis of unspecified eye, unspecified eyelid: Secondary | ICD-10-CM | POA: Diagnosis not present

## 2019-05-11 ENCOUNTER — Other Ambulatory Visit: Payer: PPO

## 2019-05-16 DIAGNOSIS — F419 Anxiety disorder, unspecified: Secondary | ICD-10-CM | POA: Diagnosis not present

## 2019-05-16 DIAGNOSIS — Z0000 Encounter for general adult medical examination without abnormal findings: Secondary | ICD-10-CM | POA: Diagnosis not present

## 2019-05-16 DIAGNOSIS — Z79899 Other long term (current) drug therapy: Secondary | ICD-10-CM | POA: Diagnosis not present

## 2019-05-16 DIAGNOSIS — Z13220 Encounter for screening for lipoid disorders: Secondary | ICD-10-CM | POA: Diagnosis not present

## 2019-05-31 DIAGNOSIS — Z6820 Body mass index (BMI) 20.0-20.9, adult: Secondary | ICD-10-CM | POA: Diagnosis not present

## 2019-05-31 DIAGNOSIS — Z130 Encounter for screening for diseases of the blood and blood-forming organs and certain disorders involving the immune mechanism: Secondary | ICD-10-CM | POA: Diagnosis not present

## 2019-05-31 DIAGNOSIS — Z1389 Encounter for screening for other disorder: Secondary | ICD-10-CM | POA: Diagnosis not present

## 2019-05-31 DIAGNOSIS — Z01419 Encounter for gynecological examination (general) (routine) without abnormal findings: Secondary | ICD-10-CM | POA: Diagnosis not present

## 2019-06-04 ENCOUNTER — Other Ambulatory Visit: Payer: Self-pay

## 2019-06-04 ENCOUNTER — Ambulatory Visit
Admission: RE | Admit: 2019-06-04 | Discharge: 2019-06-04 | Disposition: A | Discharge status: Home / Self Care | Payer: PPO | Source: Ambulatory Visit | Attending: Gynecology | Admitting: Gynecology

## 2019-06-04 DIAGNOSIS — M810 Age-related osteoporosis without current pathological fracture: Secondary | ICD-10-CM | POA: Diagnosis not present

## 2019-06-04 DIAGNOSIS — Z1211 Encounter for screening for malignant neoplasm of colon: Secondary | ICD-10-CM | POA: Diagnosis not present

## 2019-06-04 DIAGNOSIS — Z780 Asymptomatic menopausal state: Secondary | ICD-10-CM | POA: Diagnosis not present

## 2019-06-04 DIAGNOSIS — E2839 Other primary ovarian failure: Secondary | ICD-10-CM

## 2019-06-04 DIAGNOSIS — Z1212 Encounter for screening for malignant neoplasm of rectum: Secondary | ICD-10-CM | POA: Diagnosis not present

## 2019-06-21 DIAGNOSIS — D225 Melanocytic nevi of trunk: Secondary | ICD-10-CM | POA: Diagnosis not present

## 2019-06-21 DIAGNOSIS — Z85820 Personal history of malignant melanoma of skin: Secondary | ICD-10-CM | POA: Diagnosis not present

## 2019-06-21 DIAGNOSIS — L814 Other melanin hyperpigmentation: Secondary | ICD-10-CM | POA: Diagnosis not present

## 2019-06-21 DIAGNOSIS — L570 Actinic keratosis: Secondary | ICD-10-CM | POA: Diagnosis not present

## 2019-06-21 DIAGNOSIS — D1801 Hemangioma of skin and subcutaneous tissue: Secondary | ICD-10-CM | POA: Diagnosis not present

## 2019-06-21 DIAGNOSIS — C44719 Basal cell carcinoma of skin of left lower limb, including hip: Secondary | ICD-10-CM | POA: Diagnosis not present

## 2019-06-21 DIAGNOSIS — L820 Inflamed seborrheic keratosis: Secondary | ICD-10-CM | POA: Diagnosis not present

## 2019-06-21 DIAGNOSIS — L821 Other seborrheic keratosis: Secondary | ICD-10-CM | POA: Diagnosis not present

## 2019-07-03 DIAGNOSIS — C44719 Basal cell carcinoma of skin of left lower limb, including hip: Secondary | ICD-10-CM | POA: Diagnosis not present

## 2019-07-03 DIAGNOSIS — L820 Inflamed seborrheic keratosis: Secondary | ICD-10-CM | POA: Diagnosis not present

## 2019-07-17 DIAGNOSIS — L080 Pyoderma: Secondary | ICD-10-CM | POA: Diagnosis not present

## 2019-07-17 DIAGNOSIS — D485 Neoplasm of uncertain behavior of skin: Secondary | ICD-10-CM | POA: Diagnosis not present

## 2019-08-14 DIAGNOSIS — E785 Hyperlipidemia, unspecified: Secondary | ICD-10-CM | POA: Diagnosis not present

## 2019-09-10 DIAGNOSIS — L905 Scar conditions and fibrosis of skin: Secondary | ICD-10-CM | POA: Diagnosis not present

## 2019-09-10 DIAGNOSIS — Z85828 Personal history of other malignant neoplasm of skin: Secondary | ICD-10-CM | POA: Diagnosis not present

## 2019-10-15 ENCOUNTER — Other Ambulatory Visit: Payer: Self-pay | Admitting: Obstetrics and Gynecology

## 2019-10-15 DIAGNOSIS — R310 Gross hematuria: Secondary | ICD-10-CM | POA: Diagnosis not present

## 2019-10-15 DIAGNOSIS — Z1231 Encounter for screening mammogram for malignant neoplasm of breast: Secondary | ICD-10-CM

## 2019-12-11 ENCOUNTER — Ambulatory Visit: Payer: PPO

## 2019-12-18 DIAGNOSIS — R319 Hematuria, unspecified: Secondary | ICD-10-CM | POA: Diagnosis not present

## 2019-12-20 DIAGNOSIS — F418 Other specified anxiety disorders: Secondary | ICD-10-CM | POA: Diagnosis not present

## 2019-12-20 DIAGNOSIS — Z23 Encounter for immunization: Secondary | ICD-10-CM | POA: Diagnosis not present

## 2019-12-20 DIAGNOSIS — Z0000 Encounter for general adult medical examination without abnormal findings: Secondary | ICD-10-CM | POA: Diagnosis not present

## 2019-12-20 DIAGNOSIS — E785 Hyperlipidemia, unspecified: Secondary | ICD-10-CM | POA: Diagnosis not present

## 2019-12-20 DIAGNOSIS — R319 Hematuria, unspecified: Secondary | ICD-10-CM | POA: Diagnosis not present

## 2020-01-01 ENCOUNTER — Ambulatory Visit
Admission: RE | Admit: 2020-01-01 | Discharge: 2020-01-01 | Disposition: A | Discharge status: Home / Self Care | Payer: PPO | Source: Ambulatory Visit | Attending: Obstetrics and Gynecology | Admitting: Obstetrics and Gynecology

## 2020-01-01 ENCOUNTER — Other Ambulatory Visit: Payer: Self-pay

## 2020-01-01 DIAGNOSIS — Z1231 Encounter for screening mammogram for malignant neoplasm of breast: Secondary | ICD-10-CM | POA: Diagnosis not present

## 2020-01-01 DIAGNOSIS — L905 Scar conditions and fibrosis of skin: Secondary | ICD-10-CM | POA: Diagnosis not present

## 2020-01-01 DIAGNOSIS — L570 Actinic keratosis: Secondary | ICD-10-CM | POA: Diagnosis not present

## 2020-01-01 DIAGNOSIS — D1801 Hemangioma of skin and subcutaneous tissue: Secondary | ICD-10-CM | POA: Diagnosis not present

## 2020-01-01 DIAGNOSIS — D225 Melanocytic nevi of trunk: Secondary | ICD-10-CM | POA: Diagnosis not present

## 2020-01-01 DIAGNOSIS — L821 Other seborrheic keratosis: Secondary | ICD-10-CM | POA: Diagnosis not present

## 2020-01-01 DIAGNOSIS — L814 Other melanin hyperpigmentation: Secondary | ICD-10-CM | POA: Diagnosis not present

## 2020-01-01 DIAGNOSIS — Z85828 Personal history of other malignant neoplasm of skin: Secondary | ICD-10-CM | POA: Diagnosis not present

## 2020-01-02 ENCOUNTER — Other Ambulatory Visit: Payer: Self-pay | Admitting: Obstetrics and Gynecology

## 2020-01-02 DIAGNOSIS — R928 Other abnormal and inconclusive findings on diagnostic imaging of breast: Secondary | ICD-10-CM

## 2020-01-17 ENCOUNTER — Other Ambulatory Visit: Payer: Self-pay

## 2020-01-17 ENCOUNTER — Ambulatory Visit: Payer: PPO

## 2020-01-17 ENCOUNTER — Ambulatory Visit
Admission: RE | Admit: 2020-01-17 | Discharge: 2020-01-17 | Disposition: A | Discharge status: Home / Self Care | Payer: PPO | Source: Ambulatory Visit | Attending: Obstetrics and Gynecology | Admitting: Obstetrics and Gynecology

## 2020-01-17 DIAGNOSIS — R928 Other abnormal and inconclusive findings on diagnostic imaging of breast: Secondary | ICD-10-CM | POA: Diagnosis not present

## 2020-01-17 DIAGNOSIS — R922 Inconclusive mammogram: Secondary | ICD-10-CM | POA: Diagnosis not present

## 2020-02-04 DIAGNOSIS — K644 Residual hemorrhoidal skin tags: Secondary | ICD-10-CM | POA: Diagnosis not present

## 2020-02-04 DIAGNOSIS — E785 Hyperlipidemia, unspecified: Secondary | ICD-10-CM | POA: Diagnosis not present

## 2020-02-04 DIAGNOSIS — K602 Anal fissure, unspecified: Secondary | ICD-10-CM | POA: Diagnosis not present

## 2020-05-01 DIAGNOSIS — Z20822 Contact with and (suspected) exposure to covid-19: Secondary | ICD-10-CM | POA: Diagnosis not present

## 2020-05-01 DIAGNOSIS — J302 Other seasonal allergic rhinitis: Secondary | ICD-10-CM | POA: Diagnosis not present

## 2020-05-15 DIAGNOSIS — Z961 Presence of intraocular lens: Secondary | ICD-10-CM | POA: Diagnosis not present

## 2020-05-15 DIAGNOSIS — H16223 Keratoconjunctivitis sicca, not specified as Sjogren's, bilateral: Secondary | ICD-10-CM | POA: Diagnosis not present

## 2020-06-21 DIAGNOSIS — R300 Dysuria: Secondary | ICD-10-CM | POA: Diagnosis not present

## 2020-06-21 DIAGNOSIS — R350 Frequency of micturition: Secondary | ICD-10-CM | POA: Diagnosis not present

## 2020-06-25 DIAGNOSIS — R350 Frequency of micturition: Secondary | ICD-10-CM | POA: Diagnosis not present

## 2020-08-04 DIAGNOSIS — D225 Melanocytic nevi of trunk: Secondary | ICD-10-CM | POA: Diagnosis not present

## 2020-08-04 DIAGNOSIS — Z85828 Personal history of other malignant neoplasm of skin: Secondary | ICD-10-CM | POA: Diagnosis not present

## 2020-08-04 DIAGNOSIS — L905 Scar conditions and fibrosis of skin: Secondary | ICD-10-CM | POA: Diagnosis not present

## 2020-08-04 DIAGNOSIS — L820 Inflamed seborrheic keratosis: Secondary | ICD-10-CM | POA: Diagnosis not present

## 2020-08-04 DIAGNOSIS — D1801 Hemangioma of skin and subcutaneous tissue: Secondary | ICD-10-CM | POA: Diagnosis not present

## 2020-08-04 DIAGNOSIS — L814 Other melanin hyperpigmentation: Secondary | ICD-10-CM | POA: Diagnosis not present

## 2020-08-04 DIAGNOSIS — L821 Other seborrheic keratosis: Secondary | ICD-10-CM | POA: Diagnosis not present

## 2020-08-04 DIAGNOSIS — I788 Other diseases of capillaries: Secondary | ICD-10-CM | POA: Diagnosis not present

## 2020-08-04 DIAGNOSIS — Z85820 Personal history of malignant melanoma of skin: Secondary | ICD-10-CM | POA: Diagnosis not present

## 2020-08-06 DIAGNOSIS — F418 Other specified anxiety disorders: Secondary | ICD-10-CM | POA: Diagnosis not present

## 2020-08-06 DIAGNOSIS — M810 Age-related osteoporosis without current pathological fracture: Secondary | ICD-10-CM | POA: Diagnosis not present

## 2020-08-06 DIAGNOSIS — E785 Hyperlipidemia, unspecified: Secondary | ICD-10-CM | POA: Diagnosis not present

## 2020-08-06 DIAGNOSIS — Z0000 Encounter for general adult medical examination without abnormal findings: Secondary | ICD-10-CM | POA: Diagnosis not present

## 2020-08-06 DIAGNOSIS — J302 Other seasonal allergic rhinitis: Secondary | ICD-10-CM | POA: Diagnosis not present

## 2020-08-06 DIAGNOSIS — R252 Cramp and spasm: Secondary | ICD-10-CM | POA: Diagnosis not present

## 2020-09-22 DIAGNOSIS — J069 Acute upper respiratory infection, unspecified: Secondary | ICD-10-CM | POA: Diagnosis not present

## 2020-12-24 ENCOUNTER — Other Ambulatory Visit: Payer: Self-pay | Admitting: Family Medicine

## 2020-12-24 DIAGNOSIS — Z1231 Encounter for screening mammogram for malignant neoplasm of breast: Secondary | ICD-10-CM

## 2021-01-21 DIAGNOSIS — H1045 Other chronic allergic conjunctivitis: Secondary | ICD-10-CM | POA: Diagnosis not present

## 2021-01-21 DIAGNOSIS — H16223 Keratoconjunctivitis sicca, not specified as Sjogren's, bilateral: Secondary | ICD-10-CM | POA: Diagnosis not present

## 2021-01-21 DIAGNOSIS — H5319 Other subjective visual disturbances: Secondary | ICD-10-CM | POA: Diagnosis not present

## 2021-02-02 DIAGNOSIS — L905 Scar conditions and fibrosis of skin: Secondary | ICD-10-CM | POA: Diagnosis not present

## 2021-02-02 DIAGNOSIS — Z85820 Personal history of malignant melanoma of skin: Secondary | ICD-10-CM | POA: Diagnosis not present

## 2021-02-02 DIAGNOSIS — L821 Other seborrheic keratosis: Secondary | ICD-10-CM | POA: Diagnosis not present

## 2021-02-02 DIAGNOSIS — L918 Other hypertrophic disorders of the skin: Secondary | ICD-10-CM | POA: Diagnosis not present

## 2021-02-02 DIAGNOSIS — L814 Other melanin hyperpigmentation: Secondary | ICD-10-CM | POA: Diagnosis not present

## 2021-02-02 DIAGNOSIS — Z85828 Personal history of other malignant neoplasm of skin: Secondary | ICD-10-CM | POA: Diagnosis not present

## 2021-02-02 DIAGNOSIS — D225 Melanocytic nevi of trunk: Secondary | ICD-10-CM | POA: Diagnosis not present

## 2021-02-02 DIAGNOSIS — L820 Inflamed seborrheic keratosis: Secondary | ICD-10-CM | POA: Diagnosis not present

## 2021-02-02 DIAGNOSIS — D1801 Hemangioma of skin and subcutaneous tissue: Secondary | ICD-10-CM | POA: Diagnosis not present

## 2021-02-02 DIAGNOSIS — L570 Actinic keratosis: Secondary | ICD-10-CM | POA: Diagnosis not present

## 2021-02-09 ENCOUNTER — Other Ambulatory Visit: Payer: Self-pay | Admitting: Family Medicine

## 2021-02-09 DIAGNOSIS — Z0000 Encounter for general adult medical examination without abnormal findings: Secondary | ICD-10-CM | POA: Diagnosis not present

## 2021-02-09 DIAGNOSIS — R12 Heartburn: Secondary | ICD-10-CM | POA: Diagnosis not present

## 2021-02-09 DIAGNOSIS — F418 Other specified anxiety disorders: Secondary | ICD-10-CM | POA: Diagnosis not present

## 2021-02-09 DIAGNOSIS — M810 Age-related osteoporosis without current pathological fracture: Secondary | ICD-10-CM

## 2021-02-09 DIAGNOSIS — E785 Hyperlipidemia, unspecified: Secondary | ICD-10-CM | POA: Diagnosis not present

## 2021-02-23 ENCOUNTER — Other Ambulatory Visit: Payer: Self-pay

## 2021-02-23 ENCOUNTER — Ambulatory Visit
Admission: RE | Admit: 2021-02-23 | Discharge: 2021-02-23 | Disposition: A | Discharge status: Home / Self Care | Payer: PPO | Source: Ambulatory Visit | Attending: Family Medicine | Admitting: Family Medicine

## 2021-02-23 DIAGNOSIS — Z1231 Encounter for screening mammogram for malignant neoplasm of breast: Secondary | ICD-10-CM

## 2021-06-26 DIAGNOSIS — U071 COVID-19: Secondary | ICD-10-CM | POA: Diagnosis not present

## 2021-07-03 DIAGNOSIS — R21 Rash and other nonspecific skin eruption: Secondary | ICD-10-CM | POA: Diagnosis not present

## 2021-07-23 ENCOUNTER — Other Ambulatory Visit: Payer: PPO

## 2021-08-11 ENCOUNTER — Other Ambulatory Visit: Payer: Self-pay

## 2021-08-11 ENCOUNTER — Ambulatory Visit
Admission: RE | Admit: 2021-08-11 | Discharge: 2021-08-11 | Disposition: A | Discharge status: Home / Self Care | Payer: PPO | Source: Ambulatory Visit | Attending: Family Medicine | Admitting: Family Medicine

## 2021-08-11 DIAGNOSIS — M810 Age-related osteoporosis without current pathological fracture: Secondary | ICD-10-CM

## 2021-08-11 DIAGNOSIS — L821 Other seborrheic keratosis: Secondary | ICD-10-CM | POA: Diagnosis not present

## 2021-08-11 DIAGNOSIS — Z85828 Personal history of other malignant neoplasm of skin: Secondary | ICD-10-CM | POA: Diagnosis not present

## 2021-08-11 DIAGNOSIS — Z85820 Personal history of malignant melanoma of skin: Secondary | ICD-10-CM | POA: Diagnosis not present

## 2021-08-11 DIAGNOSIS — Z08 Encounter for follow-up examination after completed treatment for malignant neoplasm: Secondary | ICD-10-CM | POA: Diagnosis not present

## 2021-08-11 DIAGNOSIS — D225 Melanocytic nevi of trunk: Secondary | ICD-10-CM | POA: Diagnosis not present

## 2021-08-11 DIAGNOSIS — L570 Actinic keratosis: Secondary | ICD-10-CM | POA: Diagnosis not present

## 2021-08-11 DIAGNOSIS — D485 Neoplasm of uncertain behavior of skin: Secondary | ICD-10-CM | POA: Diagnosis not present

## 2021-08-11 DIAGNOSIS — L814 Other melanin hyperpigmentation: Secondary | ICD-10-CM | POA: Diagnosis not present

## 2021-11-20 ENCOUNTER — Other Ambulatory Visit: Payer: Self-pay | Admitting: Family Medicine

## 2021-11-20 DIAGNOSIS — Z1231 Encounter for screening mammogram for malignant neoplasm of breast: Secondary | ICD-10-CM

## 2022-03-03 ENCOUNTER — Ambulatory Visit: Payer: PPO

## 2022-03-04 ENCOUNTER — Ambulatory Visit
Admission: RE | Admit: 2022-03-04 | Discharge: 2022-03-04 | Disposition: A | Discharge status: Home / Self Care | Payer: PPO | Source: Ambulatory Visit | Attending: Family Medicine | Admitting: Family Medicine

## 2022-03-04 DIAGNOSIS — Z1231 Encounter for screening mammogram for malignant neoplasm of breast: Secondary | ICD-10-CM

## 2022-06-27 LAB — COLOGUARD: COLOGUARD: NEGATIVE

## 2022-08-26 ENCOUNTER — Emergency Department (HOSPITAL_BASED_OUTPATIENT_CLINIC_OR_DEPARTMENT_OTHER)
Admission: EM | Admit: 2022-08-26 | Discharge: 2022-08-26 | Disposition: A | Discharge status: Home / Self Care | Payer: PPO | Attending: Emergency Medicine | Admitting: Emergency Medicine

## 2022-08-26 ENCOUNTER — Emergency Department (HOSPITAL_BASED_OUTPATIENT_CLINIC_OR_DEPARTMENT_OTHER): Payer: PPO

## 2022-08-26 ENCOUNTER — Encounter (HOSPITAL_BASED_OUTPATIENT_CLINIC_OR_DEPARTMENT_OTHER): Payer: Self-pay

## 2022-08-26 DIAGNOSIS — G510 Bell's palsy: Secondary | ICD-10-CM | POA: Diagnosis not present

## 2022-08-26 DIAGNOSIS — R29810 Facial weakness: Secondary | ICD-10-CM | POA: Diagnosis present

## 2022-08-26 LAB — COMPREHENSIVE METABOLIC PANEL
ALT: 10 U/L (ref 0–44)
AST: 14 U/L — ABNORMAL LOW (ref 15–41)
Albumin: 4.7 g/dL (ref 3.5–5.0)
Alkaline Phosphatase: 45 U/L (ref 38–126)
Anion gap: 9 (ref 5–15)
BUN: 18 mg/dL (ref 8–23)
CO2: 26 mmol/L (ref 22–32)
Calcium: 9.6 mg/dL (ref 8.9–10.3)
Chloride: 104 mmol/L (ref 98–111)
Creatinine, Ser: 0.82 mg/dL (ref 0.44–1.00)
GFR, Estimated: >60 mL/min (ref >60)
Glucose, Bld: 156 mg/dL — ABNORMAL HIGH (ref 70–99)
Potassium: 3.8 mmol/L (ref 3.5–5.1)
Sodium: 139 mmol/L (ref 135–145)
Total Bilirubin: 0.4 mg/dL (ref 0.3–1.2)
Total Protein: 7 g/dL (ref 6.5–8.1)

## 2022-08-26 LAB — CBC WITH DIFFERENTIAL/PLATELET
Abs Immature Granulocytes: 0.01 10*3/uL (ref 0.00–0.07)
Basophils Absolute: 0.1 10*3/uL (ref 0.0–0.1)
Basophils Relative: 1 %
Eosinophils Absolute: 0.1 10*3/uL (ref 0.0–0.5)
Eosinophils Relative: 2 %
HCT: 40.1 % (ref 36.0–46.0)
Hemoglobin: 13.3 g/dL (ref 12.0–15.0)
Immature Granulocytes: 0 %
Lymphocytes Relative: 32 %
Lymphs Abs: 1.8 10*3/uL (ref 0.7–4.0)
MCH: 29.8 pg (ref 26.0–34.0)
MCHC: 33.2 g/dL (ref 30.0–36.0)
MCV: 89.9 fL (ref 80.0–100.0)
Monocytes Absolute: 0.4 10*3/uL (ref 0.1–1.0)
Monocytes Relative: 7 %
Neutro Abs: 3.3 10*3/uL (ref 1.7–7.7)
Neutrophils Relative %: 58 %
Platelets: 143 10*3/uL — ABNORMAL LOW (ref 150–400)
RBC: 4.46 MIL/uL (ref 3.87–5.11)
RDW: 12.9 % (ref 11.5–15.5)
WBC: 5.7 10*3/uL (ref 4.0–10.5)
nRBC: 0 % (ref 0.0–0.2)

## 2022-08-26 MED ORDER — PREDNISONE 10 MG PO TABS: ORAL_TABLET | ORAL | 0 refills | Status: AC

## 2022-08-26 MED ORDER — PREDNISONE 50 MG PO TABS
60.0000 mg | ORAL_TABLET | Freq: Once | ORAL | Status: AC
Start: 2022-08-26 — End: 2022-08-26
  Administered 2022-08-26: 60 mg via ORAL
  Filled 2022-08-26: qty 1

## 2022-08-26 MED ORDER — PREDNISONE 20 MG PO TABS: ORAL_TABLET | ORAL | 0 refills | Status: DC

## 2022-08-26 NOTE — ED Provider Triage Note (Signed)
Emergency Medicine Provider Triage Evaluation Note  MORGANNE HAILE , a 71 y.o. female  was evaluated in triage.  Pt complains of left-sided facial weakness, drooling, difficulty closing left eye, last seen normal last night..  Review of Systems  Positive: Left facial weakness Negative: Arm or leg weakness difficulty speaking or swallowing  Physical Exam  BP (!) 156/84 (BP Location: Right Arm)   Pulse (!) 103   Temp 98.2 F (36.8 C) (Oral)   Resp 17   SpO2 97%  Gen:   Awake, no distress   Resp:  Normal effort  MSK:   Moves extremities without difficulty  Other:  Left-sided facial droop.  Difficulty closing left eye.  Able to wrinkle forehead on the left but decreased compared to other side.  Medical Decision Making  Medically screening exam initiated at 10:32 PM.  Appropriate orders placed.  ROSAELENA KEMNITZ was informed that the remainder of the evaluation will be completed by another provider, this initial triage assessment does not replace that evaluation, and the importance of remaining in the ED until their evaluation is complete.  Likely Bell's palsy.  Last seen normal 24 hours ago.   Ezequiel Essex, MD 08/26/22 2233

## 2022-08-26 NOTE — ED Triage Notes (Signed)
Pt c/o L sided facial paralysis onset today, no additional neurological symptoms; stroke screen clear. Reports surgery on face approx 5wks ago, no issues since.

## 2022-08-26 NOTE — ED Provider Notes (Addendum)
[facility]ENC[facility] Provider Note: Georgena Spurling, MD, FACEP  CSN: [id number] MRN: [id number] ARRIVAL: 08/26/22 at 2213 ROOM: DB010/DB010   CHIEF COMPLAINT  Facial Droop   HISTORY OF PRESENT ILLNESS  08/26/22 10:46 PM Sandra Gates is a 71 y.o. female who is felt dryness of her left eye for the past 2 days.  She saw her eye doctor earlier today who told her her left eye was not closing completely which is likely causing it to dry out.  She was started on steroid drops in that eye.  This evening the patient noticed that the left side of her face was weak and drooping.  It is not completely paralyzed.  She is also having continued difficulty closing that eye.  She is having no discomfort on the left side of her face but is having some discomfort below her right ear.  She recently had Mohs surgery on the right side of her face for skin cancer.  He is having no weakness below her face and is having no numbness or paresthesias.   History reviewed. No pertinent past medical history.  History reviewed. No pertinent surgical history.  History reviewed. No pertinent family history.     Prior to Admission medications   Medication Sig Start Date End Date Taking? Authorizing Provider  predniSONE (DELTASONE) 20 MG tablet 6 tabs daily x 4 days, then decrease dosage by one tablet daily (5-4-3-2-1) for the next five days. 08/26/22  Yes Kyndell Zeiser, MD    Allergies Patient has no allergy information on record.   REVIEW OF SYSTEMS  Negative except as noted here or in the History of Present Illness.   PHYSICAL EXAMINATION  Initial Vital Signs Blood pressure (!) 156/84, pulse (!) 103, temperature 98.2 F (36.8 C), temperature source Oral, resp. rate 17, SpO2 97 %.  Examination General: Well-developed, well-nourished female in no acute distress; appearance consistent with age of record HENT: normocephalic; atraumatic Eyes: pupils equal, round and reactive to light; extraocular muscles  intact Neck: supple; no bruit Heart: regular rate and rhythm Lungs: clear to auscultation bilaterally Abdomen: soft; nondistended; nontender; bowel sounds present Extremities: No deformity; full range of motion; pulses normal Neurologic: Awake, alert and oriented; motor function intact in all extremities and symmetric; sensation intact over face, trunk and extremities and symmetrical; partial left facial droop with weakness of closure of the left eye Skin: Warm and dry Psychiatric: Normal mood and affect   RESULTS  Summary of this visit's results, reviewed and interpreted by myself:   EKG Interpretation  Date/Time:    Ventricular Rate:    PR Interval:    QRS Duration:   QT Interval:    QTC Calculation:   R Axis:     Text Interpretation:         Laboratory Studies: Results for orders placed or performed during the hospital encounter of 08/26/22 (from the past 24 hour(s))  CBC with Differential     Status: Abnormal   Collection Time: 08/26/22 10:26 PM  Result Value Ref Range   WBC 5.7 4.0 - 10.5 K/uL   RBC 4.46 3.87 - 5.11 MIL/uL   Hemoglobin 13.3 12.0 - 15.0 g/dL   HCT 40.1 36.0 - 46.0 %   MCV 89.9 80.0 - 100.0 fL   MCH 29.8 26.0 - 34.0 pg   MCHC 33.2 30.0 - 36.0 g/dL   RDW 12.9 11.5 - 15.5 %   Platelets 143 (L) 150 - 400 K/uL   nRBC 0.0 0.0 - 0.2 %  Neutrophils Relative % 58 %   Neutro Abs 3.3 1.7 - 7.7 K/uL   Lymphocytes Relative 32 %   Lymphs Abs 1.8 0.7 - 4.0 K/uL   Monocytes Relative 7 %   Monocytes Absolute 0.4 0.1 - 1.0 K/uL   Eosinophils Relative 2 %   Eosinophils Absolute 0.1 0.0 - 0.5 K/uL   Basophils Relative 1 %   Basophils Absolute 0.1 0.0 - 0.1 K/uL   Immature Granulocytes 0 %   Abs Immature Granulocytes 0.01 0.00 - 0.07 K/uL  Comprehensive metabolic panel     Status: Abnormal   Collection Time: 08/26/22 10:26 PM  Result Value Ref Range   Sodium 139 135 - 145 mmol/L   Potassium 3.8 3.5 - 5.1 mmol/L   Chloride 104 98 - 111 mmol/L   CO2 26 22 -  32 mmol/L   Glucose, Bld 156 (H) 70 - 99 mg/dL   BUN 18 8 - 23 mg/dL   Creatinine, Ser 0.82 0.44 - 1.00 mg/dL   Calcium 9.6 8.9 - 10.3 mg/dL   Total Protein 7.0 6.5 - 8.1 g/dL   Albumin 4.7 3.5 - 5.0 g/dL   AST 14 (L) 15 - 41 U/L   ALT 10 0 - 44 U/L   Alkaline Phosphatase 45 38 - 126 U/L   Total Bilirubin 0.4 0.3 - 1.2 mg/dL   GFR, Estimated >60 >60 mL/min   Anion gap 9 5 - 15   Imaging Studies: CT Head Wo Contrast  Result Date: 08/26/2022 CLINICAL DATA:  Left-sided facial paralysis EXAM: CT HEAD WITHOUT CONTRAST TECHNIQUE: Contiguous axial images were obtained from the base of the skull through the vertex without intravenous contrast. RADIATION DOSE REDUCTION: This exam was performed according to the departmental dose-optimization program which includes automated exposure control, adjustment of the mA and/or kV according to patient size and/or use of iterative reconstruction technique. COMPARISON:  None Available. FINDINGS: Brain: No acute territorial infarction, hemorrhage or intracranial mass. Mild atrophy. Nonenlarged ventricles Vascular: No hyperdense vessels.  No unexpected calcification Skull: Normal. Negative for fracture or focal lesion. Sinuses/Orbits: Patchy mucosal thickening in the right ethmoid sinus Other: None IMPRESSION: 1. No CT evidence for acute intracranial abnormality. 2. Mild atrophy. Electronically Signed   By: Donavan Foil M.D.   On: 08/26/2022 22:42    ED COURSE and MDM  Nursing notes, initial and subsequent vitals signs, including pulse oximetry, reviewed and interpreted by myself.  Vitals:   08/26/22 2220  BP: (!) 156/84  Pulse: (!) 103  Resp: 17  Temp: 98.2 F (36.8 C)  TempSrc: Oral  SpO2: 97%   Medications  predniSONE (DELTASONE) tablet 60 mg (has no administration in time range)    The patient's symptoms and examination are consistent with Bell's palsy.  There is not sparing of the eye closure on the left which is characteristic of Bell's palsy  and would not be expected in a stroke.  The dryness in her left eye may have been prodromal before the Bell's palsy declared itself later today.  We will start her on this steroid taper.  PROCEDURES  Procedures   ED DIAGNOSES     ICD-10-CM   1. Bell's palsy  G51.0          Rian Koon, MD 08/26/22 2310    Shanon Rosser, MD 08/26/22 2311

## 2022-08-26 NOTE — Discharge Instructions (Addendum)
You should contact your eye doctor tomorrow and inform her that you were diagnosed with Bell's palsy in the emergency department.  This apparently progressed and declared itself after your visit.  You were started on a prednisone course.  She may wish to discontinue your steroid eyedrops.

## 2022-08-26 NOTE — ED Notes (Signed)
Pt agreeable with d/c plan as discussed by provider- this nurse has verbally reinforced d/c instructions and provided pt with written copy - pt acknowledges verbal understanding and denies any add'l  questions concerns needs

## 2022-11-09 ENCOUNTER — Other Ambulatory Visit: Payer: Self-pay | Admitting: Family Medicine

## 2022-11-09 DIAGNOSIS — Z1231 Encounter for screening mammogram for malignant neoplasm of breast: Secondary | ICD-10-CM

## 2023-03-03 ENCOUNTER — Other Ambulatory Visit: Payer: Self-pay

## 2023-03-03 DIAGNOSIS — M810 Age-related osteoporosis without current pathological fracture: Secondary | ICD-10-CM

## 2023-03-14 ENCOUNTER — Ambulatory Visit
Admission: RE | Admit: 2023-03-14 | Discharge: 2023-03-14 | Disposition: A | Discharge status: Home / Self Care | Payer: PPO | Source: Ambulatory Visit | Attending: Family Medicine | Admitting: Family Medicine

## 2023-03-14 DIAGNOSIS — Z1231 Encounter for screening mammogram for malignant neoplasm of breast: Secondary | ICD-10-CM

## 2023-03-18 ENCOUNTER — Other Ambulatory Visit: Payer: Self-pay | Admitting: Family Medicine

## 2023-03-18 DIAGNOSIS — M810 Age-related osteoporosis without current pathological fracture: Secondary | ICD-10-CM

## 2023-05-08 ENCOUNTER — Emergency Department (HOSPITAL_BASED_OUTPATIENT_CLINIC_OR_DEPARTMENT_OTHER)
Admission: EM | Admit: 2023-05-08 | Discharge: 2023-05-08 | Disposition: A | Discharge status: Home / Self Care | Payer: PPO | Source: Home / Self Care | Attending: Emergency Medicine | Admitting: Emergency Medicine

## 2023-05-08 ENCOUNTER — Encounter (HOSPITAL_BASED_OUTPATIENT_CLINIC_OR_DEPARTMENT_OTHER): Payer: Self-pay

## 2023-05-08 DIAGNOSIS — S46911A Strain of unspecified muscle, fascia and tendon at shoulder and upper arm level, right arm, initial encounter: Secondary | ICD-10-CM | POA: Insufficient documentation

## 2023-05-08 DIAGNOSIS — M542 Cervicalgia: Secondary | ICD-10-CM | POA: Diagnosis not present

## 2023-05-08 DIAGNOSIS — X58XXXA Exposure to other specified factors, initial encounter: Secondary | ICD-10-CM | POA: Insufficient documentation

## 2023-05-08 DIAGNOSIS — S4991XA Unspecified injury of right shoulder and upper arm, initial encounter: Secondary | ICD-10-CM | POA: Diagnosis present

## 2023-05-08 HISTORY — DX: Unspecified malignant neoplasm of skin, unspecified: C44.90

## 2023-05-08 MED ORDER — METHOCARBAMOL 500 MG PO TABS: 500.0000 mg | ORAL_TABLET | Freq: Three times a day (TID) | ORAL | 0 refills | Status: AC | PRN

## 2023-05-08 MED ORDER — NAPROXEN 375 MG PO TABS: 375.0000 mg | ORAL_TABLET | Freq: Two times a day (BID) | ORAL | 0 refills | Status: AC

## 2023-05-08 NOTE — Discharge Instructions (Addendum)
1.  Take naproxen (Aleve) twice a day for the next 3 to 5 days to see if your symptoms are improving.  You may also take Robaxin, a muscle relaxer if needed. 2.  Start doing stretches and light shoulder exercises as reviewed in the emergency department. 3.  Follow-up with your orthopedic specialist if needed in the next 7 to 10 days.

## 2023-05-08 NOTE — ED Notes (Signed)
Discharge instructions, shoulder exercises, follow up care, pain management, and prescriptions reviewed and explained. Ice pack provided. Pt had no further questions on d/c. Pt caox4, ambulatory, NAD on d/c.

## 2023-05-08 NOTE — ED Triage Notes (Signed)
She c/o atraumatic right shoulder pain which is worse with movement. She cites recent swimming as her only unusual activity.

## 2023-05-08 NOTE — ED Provider Notes (Signed)
[place] EMERGENCY DEPARTMENT AT [facility] Provider Note   CSN: [id number] Arrival date & time: 05/08/23  0710     History  Chief Complaint  Patient presents with   Shoulder Pain    Sandra Gates is a 72 y.o. female.  HPI Patient reports he awakened this morning at about 5 AM with soreness in her right shoulder and trapezius area.  She reports she also feels some burning in her armpit.  No weakness numbness or tingling to the arm.  No chest pain no shortness of breath no nausea no lightheadedness.  Patient reports she feels good she just has discomfort in her shoulder and trapezius area.  She has not tried anything for pain.  Patient reports that she did start swimming in her pool again recently.  She had not been swimming or doing any upper body exercises and started doing some breaststroke for physical conditioning.  She did not have any pain at the time.  She has had some occasional problems with neck pain but no chronic pain.  No prior surgeries or procedures.    Home Medications Prior to Admission medications   Medication Sig Start Date End Date Taking? Authorizing Provider  methocarbamol (ROBAXIN) 500 MG tablet Take 1 tablet (500 mg total) by mouth every 8 (eight) hours as needed for muscle spasms. 05/08/23  Yes Arby Barrette, MD  naproxen (NAPROSYN) 375 MG tablet Take 1 tablet (375 mg total) by mouth 2 (two) times daily. 05/08/23  Yes Arby Barrette, MD  predniSONE (DELTASONE) 10 MG tablet 6 tabs daily x 4 days, then decrease dosage by one tablet daily (5-4-3-2-1) for the next five days. 08/26/22   Molpus, Jonny Ruiz, MD      Allergies    Patient has no known allergies.    Review of Systems   Review of Systems  Physical Exam Updated Vital Signs BP (!) 164/71   Pulse 97   Resp 16   SpO2 98%  Physical Exam Constitutional:      Comments: Alert nontoxic well in appearance.  Well-nourished well-developed.  HENT:     Right Ear: Tympanic membrane normal.      Left Ear: Tympanic membrane normal.     Mouth/Throat:     Pharynx: Oropharynx is clear.  Eyes:     Extraocular Movements: Extraocular movements intact.  Neck:     Comments: No significant reproducible midline cervical spine tenderness.  The right paraspinous muscle bodies to the trapezius and top of the shoulder are very tender to palpation.  Left nontender.  Anterior neck supple. Cardiovascular:     Rate and Rhythm: Normal rate and regular rhythm.  Pulmonary:     Effort: Pulmonary effort is normal.     Breath sounds: Normal breath sounds.  Musculoskeletal:     Cervical back: Neck supple.     Comments: Significant pain to palpation in the posterior and superior aspects of the glenohumeral fossa.  Also significant pain to palpation along the superior aspect of the medial scapula.  Patient does have intact range of motion at the shoulder joint.  No swelling of the arm and no soft tissue abnormalities.  Radial pulses 2+ and strong.  Grip strength is 5+.  No weakness of the extremity.  Lower extremities good condition without edema, calves soft and nontender.  Skin:    General: Skin is warm and dry.  Neurological:     General: No focal deficit present.     Mental Status: She is oriented to person,  place, and time.     Coordination: Coordination normal.  Psychiatric:        Mood and Affect: Mood normal.     ED Results / Procedures / Treatments   Labs (all labs ordered are listed, but only abnormal results are displayed) Labs Reviewed - No data to display  EKG None  Radiology No results found.  Procedures Procedures    Medications Ordered in ED Medications - No data to display  ED Course/ Medical Decision Making/ A&P                             Medical Decision Making  Patient presents with right-sided shoulder, lateral neck and trapezius discomfort.  Patient had recently increased her exercise regimen to swimming with breaststroke.  At this time with combination of likely  reciprocating event and physical exam that cooperates musculoskeletal pain, I have low suspicion for ACS\PE\pneumothorax or other emergent condition.  At this time we will trial NSAIDs which patient reports she tolerates and Robaxin.  I have reviewed and illustrated range of motion exercises for the shoulder and recommend continued exercise with stretching and follow-up with PCP or orthopedics.  Patient reports she has previously been seen by Ortho for frozen shoulder.        Final Clinical Impression(s) / ED Diagnoses Final diagnoses:  Right shoulder strain, initial encounter    Rx / DC Orders ED Discharge Orders          Ordered    naproxen (NAPROSYN) 375 MG tablet  2 times daily        05/08/23 0743    methocarbamol (ROBAXIN) 500 MG tablet  Every 8 hours PRN        05/08/23 0743              Arby Barrette, MD 05/08/23 [phone]

## 2023-10-03 ENCOUNTER — Inpatient Hospital Stay: Admission: RE | Admit: 2023-10-03 | Payer: PPO | Source: Ambulatory Visit

## 2023-10-03 ENCOUNTER — Ambulatory Visit
Admission: RE | Admit: 2023-10-03 | Discharge: 2023-10-03 | Disposition: A | Discharge status: Home / Self Care | Payer: PPO | Source: Ambulatory Visit | Attending: Family Medicine | Admitting: Family Medicine

## 2023-10-03 DIAGNOSIS — M810 Age-related osteoporosis without current pathological fracture: Secondary | ICD-10-CM

## 2023-12-07 ENCOUNTER — Other Ambulatory Visit: Payer: Self-pay | Admitting: Family Medicine

## 2023-12-07 DIAGNOSIS — Z1231 Encounter for screening mammogram for malignant neoplasm of breast: Secondary | ICD-10-CM

## 2024-03-15 ENCOUNTER — Ambulatory Visit: Payer: PPO

## 2024-03-19 ENCOUNTER — Ambulatory Visit
Admission: RE | Admit: 2024-03-19 | Discharge: 2024-03-19 | Disposition: A | Discharge status: Home / Self Care | Source: Ambulatory Visit | Attending: Family Medicine | Admitting: Family Medicine

## 2024-03-19 DIAGNOSIS — Z1231 Encounter for screening mammogram for malignant neoplasm of breast: Secondary | ICD-10-CM

## 2024-07-30 ENCOUNTER — Other Ambulatory Visit: Payer: Self-pay | Admitting: Family Medicine

## 2024-07-30 DIAGNOSIS — M7989 Other specified soft tissue disorders: Secondary | ICD-10-CM

## 2024-07-31 ENCOUNTER — Ambulatory Visit
Admission: RE | Admit: 2024-07-31 | Discharge: 2024-07-31 | Disposition: A | Discharge status: Home / Self Care | Source: Ambulatory Visit | Attending: Family Medicine | Admitting: Family Medicine

## 2024-07-31 DIAGNOSIS — M7989 Other specified soft tissue disorders: Secondary | ICD-10-CM
